# Patient Record
Sex: Male | Born: 1995 | Race: Black or African American | Hispanic: No | Marital: Single | Smoking: Never smoker
Health system: Southern US, Community
[De-identification: ages and names within clinical notes are randomized; demographics above are authoritative.]

---
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
## 2018-07-17 ENCOUNTER — Other Ambulatory Visit: Payer: Self-pay

## 2018-07-17 ENCOUNTER — Emergency Department
Admission: EM | Admit: 2018-07-17 | Discharge: 2018-07-18 | Disposition: A | Discharge status: Home / Self Care | Payer: BLUE CROSS/BLUE SHIELD | Attending: Emergency Medicine | Admitting: Emergency Medicine

## 2018-07-17 DIAGNOSIS — Z20828 Contact with and (suspected) exposure to other viral communicable diseases: Secondary | ICD-10-CM | POA: Diagnosis not present

## 2018-07-17 DIAGNOSIS — R509 Fever, unspecified: Secondary | ICD-10-CM | POA: Diagnosis not present

## 2018-07-17 DIAGNOSIS — J029 Acute pharyngitis, unspecified: Secondary | ICD-10-CM | POA: Insufficient documentation

## 2018-07-17 LAB — GROUP A STREP BY PCR: Group A Strep by PCR: NOT DETECTED

## 2018-07-17 LAB — SARS CORONAVIRUS 2 BY RT PCR (HOSPITAL ORDER, PERFORMED IN ~~LOC~~ HOSPITAL LAB): SARS Coronavirus 2: NEGATIVE

## 2018-07-17 MED ORDER — DEXAMETHASONE SODIUM PHOSPHATE 10 MG/ML IJ SOLN
10.0000 mg | Freq: Once | INTRAMUSCULAR | Status: AC
  Administered 2018-07-17: 10 mg via INTRAMUSCULAR

## 2018-07-17 MED ORDER — IBUPROFEN 800 MG PO TABS
800.0000 mg | ORAL_TABLET | Freq: Once | ORAL | Status: AC
  Administered 2018-07-17: 800 mg via ORAL
  Filled 2018-07-17: qty 1

## 2018-07-17 MED ORDER — DEXAMETHASONE SODIUM PHOSPHATE 10 MG/ML IJ SOLN
10.0000 mg | Freq: Once | INTRAMUSCULAR | Status: DC
  Filled 2018-07-17: qty 1

## 2018-07-17 MED ORDER — IBUPROFEN 600 MG PO TABS: 600.0000 mg | ORAL_TABLET | Freq: Three times a day (TID) | ORAL | 0 refills | Status: DC | PRN

## 2018-07-17 NOTE — ED Notes (Signed)
Called lab. Will have to re-run strep d/t there being too much mucous.

## 2018-07-17 NOTE — Discharge Instructions (Addendum)
Please seek medical attention for any high fevers, chest pain, shortness of breath, change in behavior, persistent vomiting, bloody stool or any other new or concerning symptoms.  

## 2018-07-17 NOTE — ED Notes (Signed)
Pt states that he had finished drinking hot tea before he walked into ED. Pt reports that he has been constantly drinking hot tea for two days due to it feeling better on his throat.

## 2018-07-17 NOTE — ED Provider Notes (Signed)
[facility] Emergency Department Provider Note    ____________________________________________   I have reviewed the triage vital signs and the nursing notes.   HISTORY  Chief Complaint Sore Throat   History limited by: Not Limited   HPI Nicholas Gibbs is a 23 y.o. male who presents to the emergency department today because of concern for sore throat. The patient started having pain yesterday. It has continued and gotten worse today. Feels like his throat is swollen. It is hard and painful to swallow. Did feel like he was getting a fever today. Denies any pain with chewing. Denies any shortness of breath. No nausea or vomiting. Says that he did have strep throat as a child.    No past medical history on file.  There are no active problems to display for this patient.   Prior to Admission medications   Not on File    Allergies Patient has no known allergies.  No family history on file.  Social History Social History   Tobacco Use  . Smoking status: Not on file  Substance Use Topics  . Alcohol use: Not on file  . Drug use: Not on file    Review of Systems Constitutional: No fever/chills Eyes: No visual changes. ENT: Positive for sore throat.  Cardiovascular: Denies chest pain. Respiratory: Denies shortness of breath. Gastrointestinal: No abdominal pain.  No nausea, no vomiting.  No diarrhea.   Genitourinary: Negative for dysuria. Musculoskeletal: Negative for back pain. Skin: Negative for rash. Neurological: Negative for headaches, focal weakness or numbness.  ____________________________________________   PHYSICAL EXAM:  VITAL SIGNS: ED Triage Vitals  Enc Vitals Group     BP 07/17/18 2025 116/65     Pulse Rate 07/17/18 2025 (!) 127     Resp 07/17/18 2025 20     Temp 07/17/18 2025 (!) 104.2 F (40.1 C)     Temp Source 07/17/18 2025 Oral     SpO2 07/17/18 2025 100 %     Weight 07/17/18 2026 158 lb (71.7 kg)     Height  07/17/18 2026 5\' 10"  (1.778 m)     Head Circumference --      Peak Flow --      Pain Score 07/17/18 2026 6    Constitutional: Alert and oriented.  Eyes: Conjunctivae are normal.  ENT      Head: Normocephalic and atraumatic.      Nose: No congestion/rhinnorhea.      Mouth/Throat: Erythema to bilateral tonsils with exudate on right tonsil. Uvula midline.       Neck: No stridor. Hematological/Lymphatic/Immunilogical: No cervical lymphadenopathy. Cardiovascular: Tachycardic, regular rhythm.  No murmurs, rubs, or gallops.  Respiratory: Normal respiratory effort without tachypnea nor retractions. Breath sounds are clear and equal bilaterally. No wheezes/rales/rhonchi. Gastrointestinal: Soft and non tender. No rebound. No guarding.  Genitourinary: Deferred Musculoskeletal: Normal range of motion in all extremities. No lower extremity edema. Neurologic:  Normal speech and language. No gross focal neurologic deficits are appreciated.  Skin:  Skin is warm, dry and intact. No rash noted. Psychiatric: Mood and affect are normal. Speech and behavior are normal. Patient exhibits appropriate insight and judgment.  ____________________________________________    LABS (pertinent positives/negatives)  COVID negative Strep negative ____________________________________________   EKG  None  ____________________________________________    RADIOLOGY  None  ____________________________________________   PROCEDURES  Procedures  ____________________________________________   INITIAL IMPRESSION / ASSESSMENT AND PLAN / ED COURSE  Pertinent labs & imaging results that were available during my care  of the patient were reviewed by me and considered in my medical decision making (see chart for details).   Patient presented to the emergency department today because of concern for sore throat. On exam patient with erythema to bilateral tonsils with some exudate to right tonsil. COVID was  negative. Strep was negative. At this time think likely viral. Discussed with patient. Will give shot of steroid to help with swelling and discomfort.   ____________________________________________   FINAL CLINICAL IMPRESSION(S) / ED DIAGNOSES  Final diagnoses:  Pharyngitis, unspecified etiology     Note: This dictation was prepared with Dragon dictation. Any transcriptional errors that result from this process are unintentional     Phineas SemenGoodman, Tenesia Escudero, MD 07/17/18 2324

## 2018-07-17 NOTE — ED Triage Notes (Signed)
Patient presents to the ED with sore throat x 2 days.  Patient states he works outside often.  Patient denies cough, shortness of breath and states he still has a sense of smell and taste.  Patient states he thinks he may have had a fever recently.  Patient is in no obvious distress at this time.

## 2022-08-16 ENCOUNTER — Emergency Department (HOSPITAL_BASED_OUTPATIENT_CLINIC_OR_DEPARTMENT_OTHER)
Admission: EM | Admit: 2022-08-16 | Discharge: 2022-08-17 | Disposition: A | Discharge status: Home / Self Care | Payer: BLUE CROSS/BLUE SHIELD | Attending: Emergency Medicine | Admitting: Emergency Medicine

## 2022-08-16 ENCOUNTER — Encounter (HOSPITAL_BASED_OUTPATIENT_CLINIC_OR_DEPARTMENT_OTHER): Payer: Self-pay | Admitting: Emergency Medicine

## 2022-08-16 ENCOUNTER — Other Ambulatory Visit: Payer: Self-pay

## 2022-08-16 DIAGNOSIS — E876 Hypokalemia: Secondary | ICD-10-CM | POA: Insufficient documentation

## 2022-08-16 DIAGNOSIS — U071 COVID-19: Secondary | ICD-10-CM | POA: Insufficient documentation

## 2022-08-16 LAB — CBC WITH DIFFERENTIAL/PLATELET
Abs Immature Granulocytes: 0.01 10*3/uL (ref 0.00–0.07)
Basophils Absolute: 0 10*3/uL (ref 0.0–0.1)
Basophils Relative: 0 %
Eosinophils Absolute: 0 10*3/uL (ref 0.0–0.5)
Eosinophils Relative: 0 %
HCT: 39.8 % (ref 39.0–52.0)
Hemoglobin: 13.8 g/dL (ref 13.0–17.0)
Immature Granulocytes: 0 %
Lymphocytes Relative: 24 %
Lymphs Abs: 0.9 10*3/uL (ref 0.7–4.0)
MCH: 30.4 pg (ref 26.0–34.0)
MCHC: 34.7 g/dL (ref 30.0–36.0)
MCV: 87.7 fL (ref 80.0–100.0)
Monocytes Absolute: 0.6 10*3/uL (ref 0.1–1.0)
Monocytes Relative: 15 %
Neutro Abs: 2.2 10*3/uL (ref 1.7–7.7)
Neutrophils Relative %: 61 %
Platelets: 148 10*3/uL — ABNORMAL LOW (ref 150–400)
RBC: 4.54 MIL/uL (ref 4.22–5.81)
RDW: 11.7 % (ref 11.5–15.5)
WBC: 3.7 10*3/uL — ABNORMAL LOW (ref 4.0–10.5)
nRBC: 0 % (ref 0.0–0.2)

## 2022-08-16 LAB — BASIC METABOLIC PANEL
Anion gap: 10 (ref 5–15)
BUN: 8 mg/dL (ref 6–20)
CO2: 23 mmol/L (ref 22–32)
Calcium: 9.1 mg/dL (ref 8.9–10.3)
Chloride: 100 mmol/L (ref 98–111)
Creatinine, Ser: 1.46 mg/dL — ABNORMAL HIGH (ref 0.61–1.24)
GFR, Estimated: >60 mL/min (ref >60)
Glucose, Bld: 101 mg/dL — ABNORMAL HIGH (ref 70–99)
Potassium: 3.4 mmol/L — ABNORMAL LOW (ref 3.5–5.1)
Sodium: 133 mmol/L — ABNORMAL LOW (ref 135–145)

## 2022-08-16 LAB — RESP PANEL BY RT-PCR (RSV, FLU A&B, COVID)  RVPGX2
Influenza A by PCR: NEGATIVE
Influenza B by PCR: NEGATIVE
Resp Syncytial Virus by PCR: NEGATIVE
SARS Coronavirus 2 by RT PCR: POSITIVE — AB

## 2022-08-16 MED ORDER — ACETAMINOPHEN 500 MG PO TABS
1000.0000 mg | ORAL_TABLET | Freq: Once | ORAL | Status: AC
  Administered 2022-08-16: 1000 mg via ORAL
  Filled 2022-08-16: qty 2

## 2022-08-16 NOTE — ED Triage Notes (Signed)
Pt presents to ED POV. Pt c/o fever, shaking, feeling dehydrated since yesterday. Pt reports s/s started after being exposed to heat for a long time yesterday. Pt reports cough began last night too.

## 2022-08-17 MED ORDER — OXYMETAZOLINE HCL 0.05 % NA SOLN: 1.0000 | Freq: Two times a day (BID) | NASAL | 0 refills | Status: AC

## 2022-08-17 MED ORDER — GUAIFENESIN-DM 100-10 MG/5ML PO SYRP: 5.0000 mL | ORAL_SOLUTION | ORAL | 0 refills | Status: AC | PRN

## 2022-08-17 MED ORDER — IBUPROFEN 600 MG PO TABS: 600.0000 mg | ORAL_TABLET | Freq: Four times a day (QID) | ORAL | 0 refills | Status: AC | PRN

## 2022-08-17 MED ORDER — ACETAMINOPHEN 325 MG PO TABS: 650.0000 mg | ORAL_TABLET | Freq: Four times a day (QID) | ORAL | 0 refills | Status: AC | PRN

## 2022-08-17 MED ORDER — PAXLOVID (150/100) 10 X 150 MG & 10 X 100MG PO TBPK: 2.0000 | ORAL_TABLET | Freq: Two times a day (BID) | ORAL | 0 refills | Status: AC

## 2022-08-17 NOTE — ED Provider Notes (Addendum)
[place] EMERGENCY DEPARTMENT AT [facility] Provider Note  CSN: [id number] Arrival date & time: 08/16/22 2136  Chief Complaint(s) Fever  HPI Nicholas Gibbs is a 27 y.o. male with past medical history as below, significant for no sig med hx who presents to the ED with complaint of fever, body aches, fatigue.  He has been feeling unwell for approximately 24 hours, was exposed to multiple children with concern for URI.  He has not received COVID-19 vaccine.  He contracted COVID-19 approximately a year ago and recovered without incident.  Spouse also with similar symptoms over the past 24 hours.  No difficulty breathing or chest pain, no abdominal pain nausea or vomiting.  Tolerant p.o. intake without much difficulty.  No change to bowel or bladder function.  No dyspnea.  Has had fever intermittently over the past 24 hours, chills.  Nonproductive cough  Past Medical History History reviewed. No pertinent past medical history. There are no problems to display for this patient.  Home Medication(s) Prior to Admission medications   Medication Sig Start Date End Date Taking? Authorizing Provider  ibuprofen (ADVIL) 600 MG tablet Take 1 tablet (600 mg total) by mouth every 8 (eight) hours as needed. 07/17/18   Phineas Semen, MD                                                                                                                                    Past Surgical History History reviewed. No pertinent surgical history. Family History History reviewed. No pertinent family history.  Social History Social History   Tobacco Use   Smoking status: Never   Smokeless tobacco: Never  Substance Use Topics   Alcohol use: Never   Drug use: Never   Allergies Patient has no known allergies.  Review of Systems Review of Systems  Constitutional:  Positive for chills, fatigue and fever.  HENT:  Negative for facial swelling and trouble swallowing.   Eyes:  Negative for  photophobia and visual disturbance.  Respiratory:  Positive for cough. Negative for shortness of breath.   Cardiovascular:  Negative for chest pain and palpitations.  Gastrointestinal:  Negative for abdominal pain, nausea and vomiting.  Endocrine: Negative for polydipsia and polyuria.  Genitourinary:  Negative for difficulty urinating and hematuria.  Musculoskeletal:  Negative for gait problem and joint swelling.  Skin:  Negative for pallor and rash.  Neurological:  Negative for syncope and headaches.  Psychiatric/Behavioral:  Negative for agitation and confusion.     Physical Exam Vital Signs  I have reviewed the triage vital signs BP 124/77 (BP Location: Right Arm)   Pulse 64   Temp 98.6 F (37 C) (Oral)   Resp 16   SpO2 100%  Physical Exam Vitals and nursing note reviewed.  Constitutional:      General: He is not in acute distress.    Appearance: Normal appearance. He is well-developed. He is diaphoretic.  HENT:  Head: Normocephalic and atraumatic.     Right Ear: External ear normal.     Left Ear: External ear normal.     Mouth/Throat:     Mouth: Mucous membranes are moist.  Eyes:     General: No scleral icterus. Cardiovascular:     Rate and Rhythm: Normal rate and regular rhythm.     Pulses: Normal pulses.     Heart sounds: Normal heart sounds.  Pulmonary:     Effort: Pulmonary effort is normal. No respiratory distress.     Breath sounds: Normal breath sounds.  Abdominal:     General: Abdomen is flat.     Palpations: Abdomen is soft.     Tenderness: There is no abdominal tenderness.  Musculoskeletal:        General: Normal range of motion.     Cervical back: No rigidity.     Right lower leg: No edema.     Left lower leg: No edema.  Skin:    General: Skin is warm.     Capillary Refill: Capillary refill takes less than 2 seconds.  Neurological:     Mental Status: He is alert and oriented to person, place, and time.     GCS: GCS eye subscore is 4. GCS verbal  subscore is 5. GCS motor subscore is 6.  Psychiatric:        Mood and Affect: Mood normal.        Behavior: Behavior normal.     ED Results and Treatments Labs (all labs ordered are listed, but only abnormal results are displayed) Labs Reviewed  RESP PANEL BY RT-PCR (RSV, FLU A&B, COVID)  RVPGX2 - Abnormal; Notable for the following components:      Result Value   SARS Coronavirus 2 by RT PCR POSITIVE (*)    All other components within normal limits  CBC WITH DIFFERENTIAL/PLATELET - Abnormal; Notable for the following components:   WBC 3.7 (*)    Platelets 148 (*)    All other components within normal limits  BASIC METABOLIC PANEL - Abnormal; Notable for the following components:   Sodium 133 (*)    Potassium 3.4 (*)    Glucose, Bld 101 (*)    Creatinine, Ser 1.46 (*)    All other components within normal limits                                                                                                                          Radiology No results found.  Pertinent labs & imaging results that were available during my care of the patient were reviewed by me and considered in my medical decision making (see MDM for details).  Medications Ordered in ED Medications  acetaminophen (TYLENOL) tablet 1,000 mg (1,000 mg Oral Given 08/16/22 2148)  Procedures Procedures  (including critical care time)  Medical Decision Making / ED Course    Medical Decision Making:    Nicholas Gibbs is a 27 y.o. male  with past medical history as below, significant for no sig med hx who presents to the ED with complaint of fever, body aches, fatigue. . The complaint involves an extensive differential diagnosis and also carries with it a high risk of complications and morbidity.  Serious etiology was considered. Ddx includes but is not limited to: Differential  diagnosis for adult fever includes but is not exclusive to community-acquired pneumonia, urinary tract infection, acute cholecystitis, viral syndrome, cellulitis, tick bourne disease,  decubitus ulcer, necrotizing fasciitis, meningitis, encephalitis, influenza, etc.   Complete initial physical exam performed, notably the patient  was nad, no hypoxia, febrile.    Reviewed and confirmed nursing documentation for past medical history, family history, social history.  Vital signs reviewed.      He is positive for COVID-19, he is not vaccinated.  Discussed Paxlovid, he is interested in pursuing this medication.  Metabolic panel was reviewed, creatinine mildly elevated but he has no prior to compare.  Potassium is also slightly depleted.  Will replace orally.  Patient is tolerant p.o. intake without difficulty, no nausea or vomiting.  He is drinking water in the room.  Discussed supportive care at home, return precautions and work restrictions, quarantine associate with COVID-19.  Reasonable for discharge with outpatient management of viral syndrome at this time.  Feeling better on recheck, stable for dc    The patient improved significantly and was discharged in stable condition. Detailed discussions were had with the patient regarding current findings, and need for close f/u with PCP or on call doctor. The patient has been instructed to return immediately if the symptoms worsen in any way for re-evaluation. Patient verbalized understanding and is in agreement with current care plan. All questions answered prior to discharge.          Additional history obtained: -Additional history obtained from spouse -External records from outside source obtained and reviewed including: Chart review including previous notes, labs, imaging, consultation notes including prior ED visits, prior labs imaging   Lab Tests: -I ordered, reviewed, and interpreted labs.   The pertinent results include:   Labs  Reviewed  RESP PANEL BY RT-PCR (RSV, FLU A&B, COVID)  RVPGX2 - Abnormal; Notable for the following components:      Result Value   SARS Coronavirus 2 by RT PCR POSITIVE (*)    All other components within normal limits  CBC WITH DIFFERENTIAL/PLATELET - Abnormal; Notable for the following components:   WBC 3.7 (*)    Platelets 148 (*)    All other components within normal limits  BASIC METABOLIC PANEL - Abnormal; Notable for the following components:   Sodium 133 (*)    Potassium 3.4 (*)    Glucose, Bld 101 (*)    Creatinine, Ser 1.46 (*)    All other components within normal limits    Notable for covid 19 positive, creatinine elevated  EKG   EKG Interpretation  Date/Time:    Ventricular Rate:    PR Interval:    QRS Duration:   QT Interval:    QTC Calculation:   R Axis:     Text Interpretation:           Imaging Studies ordered: na   Medicines ordered and prescription drug management: Meds ordered this encounter  Medications   acetaminophen (TYLENOL) tablet 1,000  mg    -I have reviewed the patients home medicines and have made adjustments as needed   Consultations Obtained: na   Cardiac Monitoring: The patient was maintained on a cardiac monitor.  I personally viewed and interpreted the cardiac monitored which showed an underlying rhythm of: NSR  Social Determinants of Health:  Diagnosis or treatment significantly limited by social determinants of health: non smoker   Reevaluation: After the interventions noted above, I reevaluated the patient and found that they have improved  Co morbidities that complicate the patient evaluation History reviewed. No pertinent past medical history.    Dispostion: Disposition decision including need for hospitalization was considered, and patient discharged from emergency department.    Final Clinical Impression(s) / ED Diagnoses Final diagnoses:  COVID-19     This chart was dictated using voice recognition  software.  Despite best efforts to proofread,  errors can occur which can change the documentation meaning.    Sloan Leiter, DO 08/17/22 0050    Sloan Leiter, DO 08/17/22 [phone]

## 2022-08-17 NOTE — Discharge Instructions (Signed)
It was a pleasure caring for you today in the emergency department.  Please return to the emergency department for any worsening or worrisome symptoms.  Specifically if you develop nausea and vomiting, difficulty tolerating oral intake, profuse diarrhea, chest pain difficulty breathing, or any other worsening or worrisome symptoms.
